# Patient Record
Sex: Male | Born: 1970 | Race: White | Hispanic: No | Marital: Married | Smoking: Never smoker
Health system: Southern US, Community
[De-identification: ages and names within clinical notes are randomized; demographics above are authoritative.]

## PROBLEM LIST (undated history)

## (undated) HISTORY — PX: INGUINAL HERNIA REPAIR: SUR1180

## (undated) HISTORY — PX: TONSILLECTOMY: SUR1361

---

## 1999-03-17 ENCOUNTER — Encounter: Admission: RE | Admit: 1999-03-17 | Discharge: 1999-04-08 | Payer: Self-pay | Admitting: Family Medicine

## 2002-04-21 ENCOUNTER — Encounter: Admission: RE | Admit: 2002-04-21 | Discharge: 2002-04-21 | Payer: Self-pay | Admitting: Psychiatry

## 2002-04-28 ENCOUNTER — Encounter: Admission: RE | Admit: 2002-04-28 | Discharge: 2002-04-28 | Payer: Self-pay | Admitting: Psychiatry

## 2002-05-12 ENCOUNTER — Encounter: Admission: RE | Admit: 2002-05-12 | Discharge: 2002-05-12 | Payer: Self-pay | Admitting: Psychiatry

## 2002-08-10 ENCOUNTER — Encounter: Admission: RE | Admit: 2002-08-10 | Discharge: 2002-08-10 | Payer: Self-pay | Admitting: Psychiatry

## 2002-08-15 ENCOUNTER — Encounter: Admission: RE | Admit: 2002-08-15 | Discharge: 2002-08-15 | Payer: Self-pay | Admitting: Psychiatry

## 2002-08-22 ENCOUNTER — Encounter: Admission: RE | Admit: 2002-08-22 | Discharge: 2002-08-22 | Payer: Self-pay | Admitting: Psychiatry

## 2002-09-04 ENCOUNTER — Encounter: Admission: RE | Admit: 2002-09-04 | Discharge: 2002-09-04 | Payer: Self-pay | Admitting: Psychiatry

## 2002-09-18 ENCOUNTER — Encounter: Admission: RE | Admit: 2002-09-18 | Discharge: 2002-09-18 | Payer: Self-pay | Admitting: Psychiatry

## 2002-10-02 ENCOUNTER — Encounter: Admission: RE | Admit: 2002-10-02 | Discharge: 2002-10-02 | Payer: Self-pay | Admitting: Psychiatry

## 2002-10-09 ENCOUNTER — Encounter: Admission: RE | Admit: 2002-10-09 | Discharge: 2002-10-09 | Payer: Self-pay | Admitting: Psychiatry

## 2002-11-06 ENCOUNTER — Encounter: Admission: RE | Admit: 2002-11-06 | Discharge: 2002-11-06 | Payer: Self-pay | Admitting: Psychiatry

## 2005-02-26 ENCOUNTER — Ambulatory Visit (HOSPITAL_COMMUNITY): Admission: RE | Admit: 2005-02-26 | Discharge: 2005-02-26 | Payer: Self-pay | Admitting: Chiropractic Medicine

## 2006-11-08 IMAGING — CR DG LUMBAR SPINE COMPLETE 4+V
1 series · 1 of 1 positions shown · non-contrast
Comparison: none

CLINICAL DATA: Low back pain.
 LUMBAR SPINE ? 5 VIEWS ? 02/26/05:

[t l-spine a.p.]
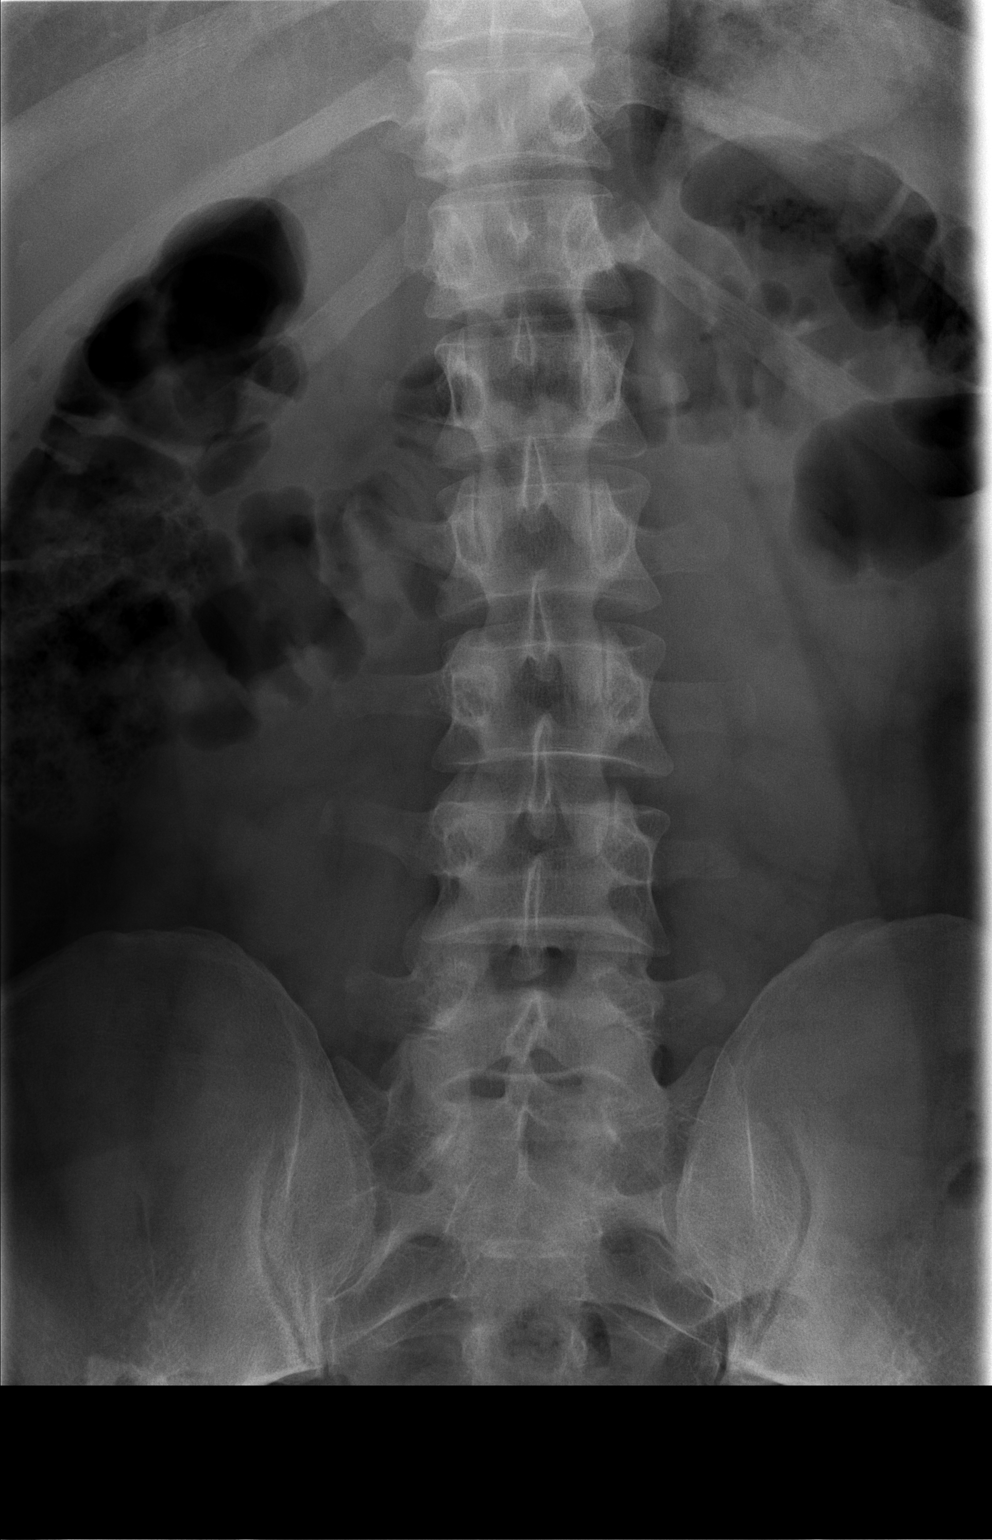

[1 of 1 positions shown; findings below may reference images not displayed]

FINDINGS: Five non-rib-bearing lumbar type vertebral bodies are identified. No loss of intervertebral disk space or vertebral body height is seen.  Alignment is within normal limits.  The neural foramina are widely patent.
IMPRESSION: No radiographic evidence for acute or chronic osseous abnormality of the lumbar spine.

## 2010-04-03 ENCOUNTER — Ambulatory Visit (HOSPITAL_COMMUNITY)
Admission: RE | Admit: 2010-04-03 | Discharge: 2010-04-03 | Payer: Self-pay | Source: Home / Self Care | Attending: Surgery | Admitting: Surgery

## 2010-06-23 LAB — SURGICAL PCR SCREEN
MRSA, PCR: NEGATIVE
Staphylococcus aureus: POSITIVE — AB

## 2010-06-23 LAB — CBC
HCT: 44.4 % (ref 39.0–52.0)
Hemoglobin: 15.7 g/dL (ref 13.0–17.0)
MCH: 30.8 pg (ref 26.0–34.0)
MCHC: 35.4 g/dL (ref 30.0–36.0)
MCV: 87.2 fL (ref 78.0–100.0)
Platelets: 196 10*3/uL (ref 150–400)
RBC: 5.09 MIL/uL (ref 4.22–5.81)
RDW: 12 % (ref 11.5–15.5)
WBC: 5.9 10*3/uL (ref 4.0–10.5)

## 2013-05-02 ENCOUNTER — Ambulatory Visit (INDEPENDENT_AMBULATORY_CARE_PROVIDER_SITE_OTHER): Payer: 59 | Admitting: Emergency Medicine

## 2013-05-02 VITALS — BP 120/76 | HR 87 | Temp 98.8°F | Resp 16 | Ht 73.75 in | Wt 229.0 lb

## 2013-05-02 DIAGNOSIS — J018 Other acute sinusitis: Secondary | ICD-10-CM

## 2013-05-02 DIAGNOSIS — J029 Acute pharyngitis, unspecified: Secondary | ICD-10-CM

## 2013-05-02 MED ORDER — PSEUDOEPHEDRINE-GUAIFENESIN ER 60-600 MG PO TB12: 1.0000 | ORAL_TABLET | Freq: Two times a day (BID) | ORAL | Status: DC

## 2013-05-02 MED ORDER — LEVOFLOXACIN 500 MG PO TABS: 500.0000 mg | ORAL_TABLET | Freq: Every day | ORAL | Status: AC

## 2013-05-02 NOTE — Patient Instructions (Signed)

## 2013-05-02 NOTE — Progress Notes (Signed)
[facility] [address], [place] [id number] [phone]  Date:  05/02/2013   Name:  Thomas Preston   DOB:  [birth year]   MRN:  [id number]  PCP:  No primary provider on file.    Chief Complaint: Sore Throat, Cough, Headache and Chills   History of Present Illness:  Thomas Preston is a 43 y.o. very pleasant male patient who presents with the following:  Awoke Saturday morning with frontal headache and sore throat.  No fever but having chills.  Hoarse.  Non productive cough.  No nasal drainage or discharge.  Headache persists.  No neuro or visual symptoms.  Malaise and myalgias.  No improvement with over the counter medications or other home remedies. .  There are no active problems to display for this patient.   History reviewed. No pertinent past medical history.  Past Surgical History  Procedure Laterality Date  . Tonsillectomy      History  Substance Use Topics  . Smoking status: Never Smoker   . Smokeless tobacco: Not on file  . Alcohol Use: Yes    Family History  Problem Relation Age of Onset  . Heart disease Mother   . Heart disease Father     Allergies  Allergen Reactions  . Penicillins Hives    Medication list has been reviewed and updated.  No current outpatient prescriptions on file prior to visit.   No current facility-administered medications on file prior to visit.    Review of Systems:  As per HPI, otherwise negative.    Physical Examination: Filed Vitals:   05/02/13 0914  BP: 120/76  Pulse: 87  Temp: 98.8 F (37.1 C)  Resp: 16   Filed Vitals:   05/02/13 0914  Height: 6' 1.75" (1.873 m)  Weight: 229 lb (103.874 kg)   Body mass index is 29.61 kg/(m^2). Ideal Body Weight: Weight in (lb) to have BMI = 25: 193  GEN: WDWN, NAD, Non-toxic, A & O x 3 HEENT: Atraumatic, Normocephalic. Neck supple. No masses, No LAD.  Tender right maxillary sinus.  Throat erythematous Ears and Nose: No external deformity.   Moderate purulent drainage right CV: RRR, No M/G/R. No JVD. No thrill. No extra heart sounds. PULM: CTA B, no wheezes, crackles, rhonchi. No retractions. No resp. distress. No accessory muscle use. ABD: S, NT, ND, +BS. No rebound. No HSM. EXTR: No c/c/e NEURO Normal gait.  PSYCH: Normally interactive. Conversant. Not depressed or anxious appearing.  Calm demeanor.    Assessment and Plan: Sinusitis  augmentin mucinex d Pharyngitis  Signed,  Phillips OdorJeffery Geriann Lafont, MD

## 2013-08-20 ENCOUNTER — Ambulatory Visit (INDEPENDENT_AMBULATORY_CARE_PROVIDER_SITE_OTHER): Payer: 59 | Admitting: Family Medicine

## 2013-08-20 VITALS — BP 120/84 | HR 62 | Temp 97.8°F | Resp 14 | Ht 72.5 in | Wt 230.4 lb

## 2013-08-20 DIAGNOSIS — R05 Cough: Secondary | ICD-10-CM

## 2013-08-20 DIAGNOSIS — J302 Other seasonal allergic rhinitis: Secondary | ICD-10-CM

## 2013-08-20 DIAGNOSIS — J309 Allergic rhinitis, unspecified: Secondary | ICD-10-CM

## 2013-08-20 DIAGNOSIS — J069 Acute upper respiratory infection, unspecified: Secondary | ICD-10-CM

## 2013-08-20 DIAGNOSIS — J029 Acute pharyngitis, unspecified: Secondary | ICD-10-CM

## 2013-08-20 DIAGNOSIS — R059 Cough, unspecified: Secondary | ICD-10-CM

## 2013-08-20 LAB — POCT RAPID STREP A (OFFICE): Rapid Strep A Screen: NEGATIVE

## 2013-08-20 MED ORDER — BENZONATATE 100 MG PO CAPS: 200.0000 mg | ORAL_CAPSULE | Freq: Two times a day (BID) | ORAL | Status: DC | PRN

## 2013-08-20 MED ORDER — AZITHROMYCIN 250 MG PO TABS: ORAL_TABLET | ORAL | Status: DC

## 2013-08-20 MED ORDER — HYDROCODONE-HOMATROPINE 5-1.5 MG/5ML PO SYRP: 5.0000 mL | ORAL_SOLUTION | Freq: Every evening | ORAL | Status: DC | PRN

## 2013-08-20 NOTE — Patient Instructions (Signed)
Pharyngitis °Pharyngitis is redness, pain, and swelling (inflammation) of your pharynx.  °CAUSES  °Pharyngitis is usually caused by infection. Most of the time, these infections are from viruses (viral) and are part of a cold. However, sometimes pharyngitis is caused by bacteria (bacterial). Pharyngitis can also be caused by allergies. Viral pharyngitis may be spread from person to person by coughing, sneezing, and personal items or utensils (cups, forks, spoons, toothbrushes). Bacterial pharyngitis may be spread from person to person by more intimate contact, such as kissing.  °SIGNS AND SYMPTOMS  °Symptoms of pharyngitis include:   °· Sore throat.   °· Tiredness (fatigue).   °· Low-grade fever.   °· Headache. °· Joint pain and muscle aches. °· Skin rashes. °· Swollen lymph nodes. °· Plaque-like film on throat or tonsils (often seen with bacterial pharyngitis). °DIAGNOSIS  °Your health care provider will ask you questions about your illness and your symptoms. Your medical history, along with a physical exam, is often all that is needed to diagnose pharyngitis. Sometimes, a rapid strep test is done. Other lab tests may also be done, depending on the suspected cause.  °TREATMENT  °Viral pharyngitis will usually get better in 3 4 days without the use of medicine. Bacterial pharyngitis is treated with medicines that kill germs (antibiotics).  °HOME CARE INSTRUCTIONS  °· Drink enough water and fluids to keep your urine clear or pale yellow.   °· Only take over-the-counter or prescription medicines as directed by your health care provider:   °· If you are prescribed antibiotics, make sure you finish them even if you start to feel better.   °· Do not take aspirin.   °· Get lots of rest.   °· Gargle with 8 oz of salt water (½ tsp of salt per 1 qt of water) as often as every 1 2 hours to soothe your throat.   °· Throat lozenges (if you are not at risk for choking) or sprays may be used to soothe your throat. °SEEK MEDICAL  CARE IF:  °· You have large, tender lumps in your neck. °· You have a rash. °· You cough up green, yellow-brown, or bloody spit. °SEEK IMMEDIATE MEDICAL CARE IF:  °· Your neck becomes stiff. °· You drool or are unable to swallow liquids. °· You vomit or are unable to keep medicines or liquids down. °· You have severe pain that does not go away with the use of recommended medicines. °· You have trouble breathing (not caused by a stuffy nose). °MAKE SURE YOU:  °· Understand these instructions. °· Will watch your condition. °· Will get help right away if you are not doing well or get worse. °Document Released: 03/30/2005 Document Revised: 01/18/2013 Document Reviewed: 12/05/2012 °ExitCare® Patient Information ©2014 [employer]. ° °

## 2013-08-20 NOTE — Progress Notes (Signed)
 Chief Complaint:  Chief Complaint  Patient presents with  . Cough    dry cough that started last night along with a severe headache.  had to sleep sitting up.  using delsym now.      HPI: Thomas Preston is a 43 y.o. male who is here for coughing with severe HA Woke him up at 1:30, coughing his brains out and feels like an animal in his throat is making him cough, has tried delsyn, cough suppressant halls He got into the bathtub for 30 min, he slept propped up against the wall. Took another delysn He denies  SOB, wheezing. , no cp, no vision changes.  Yesterday morning he has throat itching and coughing, he took one dose of nasonex.  Has had had allergies before He has a h/o migraine Last 6-8years he feels like it is worse in spring, more sneezing   No past medical history on file. Past Surgical History  Procedure Laterality Date  . Tonsillectomy     History   Social History  . Marital Status: Single    Spouse Name: N/A    Number of Children: N/A  . Years of Education: N/A   Social History Main Topics  . Smoking status: Never Smoker   . Smokeless tobacco: None  . Alcohol Use: Yes  . Drug Use: No  . Sexual Activity: None   Other Topics Concern  . None   Social History Narrative  . None   Family History  Problem Relation Age of Onset  . Heart disease Mother   . Heart disease Father    Allergies  Allergen Reactions  . Penicillins Hives   Prior to Admission medications   Medication Sig Start Date End Date Taking? Authorizing Provider  dextromethorphan (DELSYM) 30 MG/5ML liquid Take 30 mg by mouth as needed for cough.   Yes Historical Provider, MD     ROS: The patient denies fevers, chills, night sweats, unintentional weight loss, chest pain, palpitations, wheezing, dyspnea on exertion, nausea, vomiting, abdominal pain, dysuria, hematuria, melena, numbness, weakness, or tingling.   All other systems have been reviewed and were otherwise negative with  the exception of those mentioned in the HPI and as above.    PHYSICAL EXAM: Filed Vitals:   08/20/13 1214  BP: 120/84  Pulse: 62  Temp: 97.8 F (36.6 C)  Resp: 20   Filed Vitals:   08/20/13 1214  Height: 6' 0.5" (1.842 m)  Weight: 230 lb 6.4 oz (104.509 kg)   Body mass index is 30.8 kg/(m^2).  General: Alert, no acute distress HEENT:  Normocephalic, atraumatic, oropharynx patent. EOMI, PERRLA, neg tonsil, TM nl, + erythematous throat Cardiovascular:  Regular rate and rhythm, no rubs murmurs or gallops.  No Carotid bruits, radial pulse intact. No pedal edema.  Respiratory: Clear to auscultation bilaterally.  No wheezes, rales, or rhonchi.  No cyanosis, no use of accessory musculature GI: No organomegaly, abdomen is soft and non-tender, positive bowel sounds.  No masses. Skin: No rashes. Neurologic: Facial musculature symmetric. Psychiatric: Patient is appropriate throughout our interaction. Lymphatic: No cervical lymphadenopathy Musculoskeletal: Gait intact.   LABS: Results for orders placed during the hospital encounter of 04/03/10  SURGICAL PCR SCREEN      Result Value Ref Range   MRSA, PCR NEGATIVE  NEGATIVE   Staphylococcus aureus   (*) NEGATIVE   Value: POSITIVE            The Xpert SA Assay (FDA  approved for NASAL specimens     only), is one component of     a comprehensive surveillance     program.  It is not intended     to diagnose infection nor to     guide or monitor treatment.  CBC      Result Value Ref Range   WBC 5.9  4.0 - 10.5 K/uL   RBC 5.09  4.22 - 5.81 MIL/uL   Hemoglobin 15.7  13.0 - 17.0 g/dL   HCT 16.144.4  09.639.0 - 04.552.0 %   MCV 87.2  78.0 - 100.0 fL   MCH 30.8  26.0 - 34.0 pg   MCHC 35.4  30.0 - 36.0 g/dL   RDW 40.912.0  81.111.5 - 91.415.5 %   Platelets 196  150 - 400 K/uL     EKG/XRAY:   Primary [facility]   ASSESSMENT/PLAN: Encounter Diagnoses  Name Primary?  . Cough Yes  . URI, acute   . Acute pharyngitis   .  Seasonal allergies    RX Azithromycin to hold Take otc claritin, take otc nasonex Rx Hycodan and tessalon perles. Try otc treatment first , if no improvement with allergy meds and sxs treatment then may use z pack F/u prn   Gross sideeffects, risk and benefits, and alternatives of medications d/w patient. Patient is aware that all medications have potential sideeffects and we are unable to predict every sideeffect or drug-drug interaction that may occur.  nell Antuhao P , DO 08/20/2013 1:55 PM

## 2013-08-22 LAB — CULTURE, GROUP A STREP: Organism ID, Bacteria: NORMAL

## 2013-08-24 ENCOUNTER — Encounter: Payer: Self-pay | Admitting: Family Medicine

## 2013-11-07 ENCOUNTER — Ambulatory Visit (INDEPENDENT_AMBULATORY_CARE_PROVIDER_SITE_OTHER): Payer: 59 | Admitting: Family Medicine

## 2013-11-07 VITALS — BP 110/68 | HR 68 | Temp 98.3°F | Resp 18 | Ht 74.0 in | Wt 219.0 lb

## 2013-11-07 DIAGNOSIS — B9789 Other viral agents as the cause of diseases classified elsewhere: Secondary | ICD-10-CM

## 2013-11-07 DIAGNOSIS — J029 Acute pharyngitis, unspecified: Secondary | ICD-10-CM

## 2013-11-07 DIAGNOSIS — R059 Cough, unspecified: Secondary | ICD-10-CM

## 2013-11-07 DIAGNOSIS — B349 Viral infection, unspecified: Secondary | ICD-10-CM

## 2013-11-07 DIAGNOSIS — R05 Cough: Secondary | ICD-10-CM

## 2013-11-07 LAB — POCT RAPID STREP A (OFFICE): Rapid Strep A Screen: NEGATIVE

## 2013-11-07 MED ORDER — MAGIC MOUTHWASH W/LIDOCAINE: 10.0000 mL | ORAL | Status: DC | PRN

## 2013-11-07 MED ORDER — AZITHROMYCIN 250 MG PO TABS: ORAL_TABLET | ORAL | Status: DC

## 2013-11-07 NOTE — Progress Notes (Signed)
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 He currently lives alone since his wife is out of town. Has not been exposed to anything that he knows of. He has a history of recurrent sore throats with the strep test being negative. He was last seen here several months ago for that. He is in here today complaining of a sore throat which started on Friday. He had worked out hard at [employer], then started feeling bad. Friday he felt bad with a bad sore throat and did nothing much through the weekend. He went to bed early Saturday night. He has had fevers. He feels tender nodes in his neck. He does not smoke. He has been coughing when he lays down especially. No runny nose.  Objective: Healthy-appearing man though he doesn't feel well today. His TMs are normal. Throat mildly erythematous. No exudate. Neck has some anterior cervical adenopathy. His chest was clear to auscultation. Heart regular without murmurs.  Assessment: Pharyngitis and cough  Plan: Strep test. Probably a viral illness.   Results for orders placed in visit on 11/07/13  POCT RAPID STREP A (OFFICE)      Result Value Ref Range   Rapid Strep A Screen Negative  Negative    Treat symptomatically but if not improving over the next couple of days, which would be day 6, will begin an antibiotic since he has a very important week ahead of him.

## 2013-11-07 NOTE — Patient Instructions (Signed)
Pharyngitis Pharyngitis is redness, pain, and swelling (inflammation) of your pharynx.  CAUSES  Pharyngitis is usually caused by infection. Most of the time, these infections are from viruses (viral) and are part of a cold. However, sometimes pharyngitis is caused by bacteria (bacterial). Pharyngitis can also be caused by allergies. Viral pharyngitis may be spread from person to person by coughing, sneezing, and personal items or utensils (cups, forks, spoons, toothbrushes). Bacterial pharyngitis may be spread from person to person by more intimate contact, such as kissing.  SIGNS AND SYMPTOMS  Symptoms of pharyngitis include:   Sore throat.   Tiredness (fatigue).   Low-grade fever.   Headache.  Joint pain and muscle aches.  Skin rashes.  Swollen lymph nodes.  Plaque-like film on throat or tonsils (often seen with bacterial pharyngitis). DIAGNOSIS  Your health care provider will ask you questions about your illness and your symptoms. Your medical history, along with a physical exam, is often all that is needed to diagnose pharyngitis. Sometimes, a rapid strep test is done. Other lab tests may also be done, depending on the suspected cause.  TREATMENT  Viral pharyngitis will usually get better in 3-4 days without the use of medicine. Bacterial pharyngitis is treated with medicines that kill germs (antibiotics).  HOME CARE INSTRUCTIONS   Drink enough water and fluids to keep your urine clear or pale yellow.   Only take over-the-counter or prescription medicines as directed by your health care provider:   If you are prescribed antibiotics, make sure you finish them even if you start to feel better.   Do not take aspirin.   Get lots of rest.   Gargle with 8 oz of salt water ( tsp of salt per 1 qt of water) as often as every 1-2 hours to soothe your throat.   Throat lozenges (if you are not at risk for choking) or sprays may be used to soothe your throat. SEEK MEDICAL  CARE IF:   You have large, tender lumps in your neck.  You have a rash.  You cough up green, yellow-brown, or bloody spit. SEEK IMMEDIATE MEDICAL CARE IF:   Your neck becomes stiff.  You drool or are unable to swallow liquids.  You vomit or are unable to keep medicines or liquids down.  You have severe pain that does not go away with the use of recommended medicines.  You have trouble breathing (not caused by a stuffy nose). MAKE SURE YOU:   Understand these instructions.  Will watch your condition.  Will get help right away if you are not doing well or get worse. Document Released: 03/30/2005 Document Revised: 01/18/2013 Document Reviewed: 12/05/2012 [facility] Patient Information 2015 [employer] [place]. This information is not intended to replace advice given to you by your health care provider. Make sure you discuss any questions you have with your health care provider.   Use Magic mouthwash as directed  If not starting to turn the corner significantly by 2 days from now go ahead and take the Zithromax

## 2013-11-09 LAB — CULTURE, GROUP A STREP: Organism ID, Bacteria: NORMAL

## 2014-06-08 ENCOUNTER — Telehealth: Payer: Self-pay

## 2014-06-08 NOTE — Telephone Encounter (Signed)
Patient came in to inquire about his immunizations.  He has a MR Record [id number] with some vaccines administered.   Is it possible to get his [place] Immunization Records for him.   [phone]

## 2014-06-08 NOTE — Telephone Encounter (Signed)
Patient left voicemail at 11:55am stating he needs copies of immunization records for employment. Cb# [phone].

## 2014-06-11 NOTE — Telephone Encounter (Signed)
Paper record pulled and reviewed.  Tdap (2008) and flu vaccines administered here documented in [facility].  This patient is not in the [facility] (likely due to his age).  There is a physical form in the paper record from 11/1994, but there are no immunizations attached or documented on it.

## 2014-06-11 NOTE — Telephone Encounter (Signed)
I do not have access to the immunization website. Can someone help with this? Print and leave in the nurses box.

## 2014-06-12 NOTE — Telephone Encounter (Signed)
Spoke with pt, advised we only have record of TDAP and flu vaccines. Pt would like a copy. Left in 102 pick-up draw.

## 2015-02-04 ENCOUNTER — Ambulatory Visit (INDEPENDENT_AMBULATORY_CARE_PROVIDER_SITE_OTHER): Payer: PRIVATE HEALTH INSURANCE | Admitting: Physician Assistant

## 2015-02-04 VITALS — BP 112/66 | HR 74 | Temp 98.2°F | Resp 16 | Ht 73.0 in | Wt 207.0 lb

## 2015-02-04 DIAGNOSIS — L03011 Cellulitis of right finger: Secondary | ICD-10-CM

## 2015-02-04 DIAGNOSIS — IMO0001 Reserved for inherently not codable concepts without codable children: Secondary | ICD-10-CM

## 2015-02-04 MED ORDER — DOXYCYCLINE HYCLATE 100 MG PO TABS: 100.0000 mg | ORAL_TABLET | Freq: Two times a day (BID) | ORAL | Status: AC

## 2015-02-04 NOTE — Patient Instructions (Signed)
Warm compresses - keep covered

## 2015-02-04 NOTE — Progress Notes (Signed)
   Thomas Preston N Pasko  MRN: [id number] DOB: [birth year]  Subjective:  Pt presents to clinic with an area on his area near his left middle nail that is swollen and red - he pushed some pus out of the area yesterday but it has not felt much better.  He has had no fevers or chills.  He has a Rx for Doxy for 7 days that he started today but he is worried because he has a tournament this weekend and he wants to make sure the infection nor the wound will cause him not to be able to participate.  There are no active problems to display for this patient.   No current outpatient prescriptions on file prior to visit.   No current facility-administered medications on file prior to visit.    Allergies  Allergen Reactions  . Penicillins Hives   Review of Systems  Constitutional: Negative for fever and chills.  Skin: Positive for wound.   Objective:  BP 112/66 mmHg  Pulse 74  Temp(Src) 98.2 F (36.8 C) (Oral)  Resp 16  Ht 6\' 1"  (1.854 m)  Wt 207 lb (93.895 kg)  BMI 27.32 kg/m2  SpO2 98%  Physical Exam  Constitutional: He is oriented to person, place, and time and well-developed, well-nourished, and in no distress.  HENT:  Head: Normocephalic and atraumatic.  Right Ear: External ear normal.  Left Ear: External ear normal.  Eyes: Conjunctivae are normal.  Neck: Normal range of motion.  Pulmonary/Chest: Effort normal.  Neurological: He is alert and oriented to person, place, and time. Gait normal.  Skin: Skin is warm and dry.  Left 3rd digit - paronychia radial aspect of the nail with erythema and TTP and swollen  Psychiatric: Mood, memory, affect and judgment normal.   Procedure:  Consent obtained.  Digital block on the radial aspect of his finger.  betadine prep and incision made into the paronychia with cloody purulence expressed.  Drsg placed. Assessment and Plan :  Paronychia of third finger of right hand - Plan: doxycycline (VIBRA-TABS) 100 MG tablet  Gave him an additional 3 days  of abx.  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  He should be healed in the next several days to allow him to participate in the tournament.  Warm soaks over the next 48h then keep it dry so it can be healed.  Benny LennertSarah Weber PA-C  [facility] 02/06/2015 11:50 AM

## 2015-06-05 ENCOUNTER — Ambulatory Visit (INDEPENDENT_AMBULATORY_CARE_PROVIDER_SITE_OTHER): Payer: PRIVATE HEALTH INSURANCE | Admitting: Family Medicine

## 2015-06-05 VITALS — BP 106/58 | HR 62 | Temp 98.0°F | Resp 16 | Ht 74.0 in | Wt 208.0 lb

## 2015-06-05 DIAGNOSIS — R21 Rash and other nonspecific skin eruption: Secondary | ICD-10-CM

## 2015-06-05 MED ORDER — DOXYCYCLINE HYCLATE 100 MG PO TABS
100.0000 mg | ORAL_TABLET | Freq: Two times a day (BID) | ORAL | Status: DC
Start: 2015-06-05 — End: 2015-06-27

## 2015-06-05 MED ORDER — MUPIROCIN 2 % EX OINT: TOPICAL_OINTMENT | CUTANEOUS | Status: DC

## 2015-06-05 NOTE — Progress Notes (Signed)
      Chief Complaint:  Chief Complaint  Patient presents with  . Rash    possible re-flare of impetigo, itching on chin and skin discoloration    HPI: Thomas Preston is a 45 y.o. male who reports to [facility] today complaining of impetigo rash which he had before. Was seen by Huntley Dec and given Doxy  Was better but now has come back.  He was seen recently  and given ketoconazole but no improvement.  Just on chin, he does shave in that area.  No fevers chills or rashes anywhere else.    History reviewed. No pertinent past medical history. Past Surgical History  Procedure Laterality Date  . Tonsillectomy    . Inguinal hernia repair Bilateral [id number]    Ardeth Sportsman, MD   Social History   Social History  . Marital Status: Single    Spouse Name: N/A  . Number of Children: N/A  . Years of Education: N/A   Social History Main Topics  . Smoking status: Never Smoker   . Smokeless tobacco: Never Used  . Alcohol Use: 0.0 oz/week    0 Standard drinks or equivalent per week  . Drug Use: No  . Sexual Activity: Not Asked   Other Topics Concern  . None   Social History Narrative   Family History  Problem Relation Age of Onset  . Heart disease Mother   . Heart disease Father    Allergies  Allergen Reactions  . Penicillins Hives   Prior to Admission medications   Not on File     ROS: The patient denies fevers, chills, night sweats, unintentional weight loss, chest pain, palpitations, wheezing, dyspnea on exertion, nausea, vomiting, abdominal pain, dysuria, hematuria, melena, numbness, weakness, or tingling.   All other systems have been reviewed and were otherwise negative with the exception of those mentioned in the HPI and as above.    PHYSICAL EXAM: Filed Vitals:   06/05/15 1810  BP: 106/58  Pulse: 62  Temp: 98 F (36.7 C)  Resp: 16   Body mass index is 26.69 kg/(m^2).   General: Alert, no acute distress HEENT:  Normocephalic, atraumatic, oropharynx patent.  EOMI, PERRLA Cardiovascular:  Regular rate and rhythm, no rubs murmurs or gallops.   Respiratory: Clear to auscultation bilaterally.  No wheezes, rales, or rhonchi.  No cyanosis, no use of accessory musculature Abdominal: No organomegaly, abdomen is soft and non-tender, positive bowel sounds. No masses. Skin: +fungal infection Neurologic: Facial musculature symmetric. Psychiatric: Patient acts appropriately throughout our interaction. Lymphatic: No cervical or submandibular lymphadenopathy Musculoskeletal: Gait intact. No edema, tenderness   LABS:    EKG/XRAY:   Primary read interpreted by Dr. Conley Rolls at [facility].   ASSESSMENT/PLAN: Encounter Diagnosis  Name Primary?  . Rash and nonspecific skin eruption Yes   ? Fungal with underlying cellulitis Use clotrimazole for possible fungal rash prevention  Use bactroban ointment for localized impetigo  If the bactroban ointment does not work then stop it and use the oral doxycycline Fu prn   Gross sideeffects, risk and benefits, and alternatives of medications d/w patient. Patient is aware that all medications have potential sideeffects and we are unable to predict every sideeffect or drug-drug interaction that may occur.    DO  06/24/2015 5:24 PM

## 2015-06-05 NOTE — Patient Instructions (Signed)
Use clotrimazole for possible fungal rash prevention  Use bactroban ointment for localized impetigo  If the bactroban ointment does not work then stop it and use the oral doxycycline

## 2015-06-27 ENCOUNTER — Ambulatory Visit (INDEPENDENT_AMBULATORY_CARE_PROVIDER_SITE_OTHER): Payer: PRIVATE HEALTH INSURANCE | Admitting: Family Medicine

## 2015-06-27 VITALS — BP 126/68 | HR 78 | Temp 98.8°F | Resp 16 | Ht 74.0 in | Wt 211.0 lb

## 2015-06-27 DIAGNOSIS — J029 Acute pharyngitis, unspecified: Secondary | ICD-10-CM

## 2015-06-27 LAB — POCT RAPID STREP A (OFFICE): Rapid Strep A Screen: NEGATIVE

## 2015-06-27 MED ORDER — HYDROCODONE-ACETAMINOPHEN 5-325 MG PO TABS: 1.0000 | ORAL_TABLET | Freq: Four times a day (QID) | ORAL | Status: AC | PRN

## 2015-06-27 MED ORDER — MAGIC MOUTHWASH W/LIDOCAINE: 10.0000 mL | ORAL | Status: AC | PRN

## 2015-06-27 NOTE — Patient Instructions (Addendum)
Drink plenty of fluids and get enough rest  Use the Magic mouthwash as directed when needed for sore throat  Take ibuprofen 200 mg 3-4 pills 3 times daily as needed for pain  Use over-the-counter lozenges as needed  Take the Norco pain pills hydrocodone/APAP 325/5 take 1 every 4-6 hours when needed for bad pain. This will cause some sedation.  We will let you know the results of your culture  Return if worse           Pharyngitis (sore throat) Pharyngitis is redness, pain, and swelling (inflammation) of your pharynx.  CAUSES  Pharyngitis is usually caused by infection. Most of the time, these infections are from viruses (viral) and are part of a cold. However, sometimes pharyngitis is caused by bacteria (bacterial). Pharyngitis can also be caused by allergies. Viral pharyngitis may be spread from person to person by coughing, sneezing, and personal items or utensils (cups, forks, spoons, toothbrushes). Bacterial pharyngitis may be spread from person to person by more intimate contact, such as kissing.  SIGNS AND SYMPTOMS  Symptoms of pharyngitis include:   Sore throat.   Tiredness (fatigue).   Low-grade fever.   Headache.  Joint pain and muscle aches.  Skin rashes.  Swollen lymph nodes.  Plaque-like film on throat or tonsils (often seen with bacterial pharyngitis). DIAGNOSIS  Your health care provider will ask you questions about your illness and your symptoms. Your medical history, along with a physical exam, is often all that is needed to diagnose pharyngitis. Sometimes, a rapid strep test is done. Other lab tests may also be done, depending on the suspected cause.  TREATMENT  Viral pharyngitis will usually get better in 3-4 days without the use of medicine. Bacterial pharyngitis is treated with medicines that kill germs (antibiotics).  HOME CARE INSTRUCTIONS   Drink enough water and fluids to keep your urine clear or pale yellow.   Only take over-the-counter or  prescription medicines as directed by your health care provider:   If you are prescribed antibiotics, make sure you finish them even if you start to feel better.   Do not take aspirin.   Get lots of rest.   Gargle with 8 oz of salt water ( tsp of salt per 1 qt of water) as often as every 1-2 hours to soothe your throat.   Throat lozenges (if you are not at risk for choking) or sprays may be used to soothe your throat. SEEK MEDICAL CARE IF:   You have large, tender lumps in your neck.  You have a rash.  You cough up green, yellow-brown, or bloody spit. SEEK IMMEDIATE MEDICAL CARE IF:   Your neck becomes stiff.  You drool or are unable to swallow liquids.  You vomit or are unable to keep medicines or liquids down.  You have severe pain that does not go away with the use of recommended medicines.  You have trouble breathing (not caused by a stuffy nose). MAKE SURE YOU:   Understand these instructions.  Will watch your condition.  Will get help right away if you are not doing well or get worse.   This information is not intended to replace advice given to you by your health care provider. Make sure you discuss any questions you have with your health care provider.   Document Released: 03/30/2005 Document Revised: 01/18/2013 Document Reviewed: 12/05/2012 Elsevier Interactive Patient Education [employer]2016 [employer] Inc.     IF you received an x-ray today, you will receive an invoice  from [facility]. Please contact [facility] at [phone] with questions or concerns regarding your invoice.   IF you received labwork today, you will receive an invoice from [employer]. Please contact [facility] at [phone] with questions or concerns regarding your invoice.   Our billing staff will not be able to assist you with questions regarding bills from these companies.  You will be contacted with the lab results as soon as they are  available. The fastest way to get your results is to activate your My Chart account. Instructions are located on the last page of this paperwork. If you have not heard from [place] regarding the results in 2 weeks, please contact this office.

## 2015-06-27 NOTE — Progress Notes (Signed)
Patient ID: Thomas Preston, male    DOB: 20-Jul-1970  Age: 45 y.o. MRN: [id number]  Chief Complaint  Patient presents with  . Sore Throat    x 5 days     Subjective:   45 year old man with a sore throat for the last 5 days which is gotten progressively worse. No other major symptoms. Not much head congestion or cough. Not been febrile but he just has a bad sore throat. He was in the cold without adequate warmth. No one else in his family is sick. He works Consulting civil engineer.  Current allergies, medications, problem list, past/family and social histories reviewed.  Objective:  BP 126/68 mmHg  Pulse 78  Temp(Src) 98.8 F (37.1 C) (Oral)  Resp 16  Ht  (1.88 m)  Wt 211 lb (95.709 kg)  BMI 27.08 kg/m2  SpO2 98%  No major distress. Voice is a little weak but not definitely hoarse. His TMs are normal. Throat erythematous without exudate. Neck supple without anterior cervical nodes. Chest is clear to auscultation. Heart regular without murmur.  Assessment & Plan:   Assessment: 1. Sore throat       Plan: Test and culture and decide treatment.  Orders Placed This Encounter  Procedures  . Culture, Group A Strep    Order Specific Question:  Source    Answer:  throat  . POCT rapid strep A   Appears to be a bad viral sore throat. Will treat symptomatically. Culture is pending.  Meds ordered this encounter  Medications  . HYDROcodone-acetaminophen (NORCO) 5-325 MG tablet    Sig: Take 1 tablet by mouth every 6 (six) hours as needed. Will cause sedation    Dispense:  12 tablet    Refill:  0  . magic mouthwash w/lidocaine SOLN    Sig: Take 10 mLs by mouth every 2 (two) hours as needed for mouth pain.    Dispense:  240 mL    Refill:  0    Ok to substitute your facility's mouth wash recipe, mix 1:1 with viscous lidocaine    Results for orders placed or performed in visit on 06/27/15  POCT rapid strep A  Result Value Ref Range   Rapid Strep A Screen Negative Negative         Patient Instructions   Drink plenty of fluids and get enough rest  Use the Magic mouthwash as directed when needed for sore throat  Take ibuprofen 200 mg 3-4 pills 3 times daily as needed for pain  Take the Norco pain pills hydrocodone/APAP 325/5 take 1 every 4-6 hours when needed for bad pain. This will cause some sedation.  We will let you know the results of your culture  Return if worse           Pharyngitis (sore throat) Pharyngitis is redness, pain, and swelling (inflammation) of your pharynx.  CAUSES  Pharyngitis is usually caused by infection. Most of the time, these infections are from viruses (viral) and are part of a cold. However, sometimes pharyngitis is caused by bacteria (bacterial). Pharyngitis can also be caused by allergies. Viral pharyngitis may be spread from person to person by coughing, sneezing, and personal items or utensils (cups, forks, spoons, toothbrushes). Bacterial pharyngitis may be spread from person to person by more intimate contact, such as kissing.  SIGNS AND SYMPTOMS  Symptoms of pharyngitis include:   Sore throat.   Tiredness (fatigue).   Low-grade fever.   Headache.  Joint pain and muscle aches.  Skin rashes.  Swollen lymph nodes.  Plaque-like film on throat or tonsils (often seen with bacterial pharyngitis). DIAGNOSIS  Your health care provider will ask you questions about your illness and your symptoms. Your medical history, along with a physical exam, is often all that is needed to diagnose pharyngitis. Sometimes, a rapid strep test is done. Other lab tests may also be done, depending on the suspected cause.  TREATMENT  Viral pharyngitis will usually get better in 3-4 days without the use of medicine. Bacterial pharyngitis is treated with medicines that kill germs (antibiotics).  HOME CARE INSTRUCTIONS   Drink enough water and fluids to keep your urine clear or pale yellow.   Only take over-the-counter or  prescription medicines as directed by your health care provider:   If you are prescribed antibiotics, make sure you finish them even if you start to feel better.   Do not take aspirin.   Get lots of rest.   Gargle with 8 oz of salt water ( tsp of salt per 1 qt of water) as often as every 1-2 hours to soothe your throat.   Throat lozenges (if you are not at risk for choking) or sprays may be used to soothe your throat. SEEK MEDICAL CARE IF:   You have large, tender lumps in your neck.  You have a rash.  You cough up green, yellow-brown, or bloody spit. SEEK IMMEDIATE MEDICAL CARE IF:   Your neck becomes stiff.  You drool or are unable to swallow liquids.  You vomit or are unable to keep medicines or liquids down.  You have severe pain that does not go away with the use of recommended medicines.  You have trouble breathing (not caused by a stuffy nose). MAKE SURE YOU:   Understand these instructions.  Will watch your condition.  Will get help right away if you are not doing well or get worse.   This information is not intended to replace advice given to you by your health care provider. Make sure you discuss any questions you have with your health care provider.   Document Released: 03/30/2005 Document Revised: 01/18/2013 Document Reviewed: 12/05/2012 Elsevier Interactive Patient Education 2016 [employer]     IF you received an x-ray today, you will receive an invoice from [facility]. Please contact [facility] at [phone] with questions or concerns regarding your invoice.   IF you received labwork today, you will receive an invoice from [facility]. Please contact [facility] at [phone] with questions or concerns regarding your invoice.   Our billing staff will not be able to assist you with questions regarding bills from these companies.  You will be contacted with the lab results as soon as they are  available. The fastest way to get your results is to activate your My Chart account. Instructions are located on the last page of this paperwork. If you have not heard from us regarding the results in 2 weeks, please contact this office.      Return if symptoms worsen or fail to improve.   HOPPER,DAVID, MD 06/27/2015

## 2015-06-28 LAB — CULTURE, GROUP A STREP: Organism ID, Bacteria: NORMAL
